# Patient Record
Sex: Female | Born: 1975 | Hispanic: Yes | Marital: Single | Smoking: Never smoker
Health system: Southern US, Community
[De-identification: ages and names within clinical notes are randomized; demographics above are authoritative.]

## PROBLEM LIST (undated history)

## (undated) DIAGNOSIS — E079 Disorder of thyroid, unspecified: Secondary | ICD-10-CM

---

## 2014-07-25 ENCOUNTER — Other Ambulatory Visit (HOSPITAL_COMMUNITY): Payer: Self-pay | Admitting: Family

## 2014-07-25 DIAGNOSIS — Z1231 Encounter for screening mammogram for malignant neoplasm of breast: Secondary | ICD-10-CM

## 2014-08-21 ENCOUNTER — Ambulatory Visit (HOSPITAL_COMMUNITY)
Admission: RE | Admit: 2014-08-21 | Discharge: 2014-08-21 | Disposition: A | Discharge status: Home / Self Care | Payer: Self-pay | Source: Ambulatory Visit | Attending: Family | Admitting: Family

## 2014-08-21 DIAGNOSIS — Z1231 Encounter for screening mammogram for malignant neoplasm of breast: Secondary | ICD-10-CM | POA: Insufficient documentation

## 2020-03-25 ENCOUNTER — Other Ambulatory Visit (HOSPITAL_COMMUNITY): Payer: Self-pay | Admitting: *Deleted

## 2020-03-25 DIAGNOSIS — Z1231 Encounter for screening mammogram for malignant neoplasm of breast: Secondary | ICD-10-CM

## 2020-04-07 ENCOUNTER — Ambulatory Visit (HOSPITAL_COMMUNITY): Payer: PRIVATE HEALTH INSURANCE

## 2020-04-10 ENCOUNTER — Ambulatory Visit (HOSPITAL_COMMUNITY)
Admission: RE | Admit: 2020-04-10 | Discharge: 2020-04-10 | Disposition: A | Discharge status: Home / Self Care | Payer: PRIVATE HEALTH INSURANCE | Source: Ambulatory Visit | Attending: *Deleted | Admitting: *Deleted

## 2020-04-10 ENCOUNTER — Other Ambulatory Visit: Payer: Self-pay

## 2020-04-10 DIAGNOSIS — Z1231 Encounter for screening mammogram for malignant neoplasm of breast: Secondary | ICD-10-CM | POA: Insufficient documentation

## 2021-08-14 ENCOUNTER — Encounter (HOSPITAL_COMMUNITY): Payer: Self-pay | Admitting: Emergency Medicine

## 2021-08-14 ENCOUNTER — Emergency Department (HOSPITAL_COMMUNITY)
Admission: EM | Admit: 2021-08-14 | Discharge: 2021-08-15 | Disposition: A | Discharge status: Home / Self Care | Payer: Self-pay | Attending: Emergency Medicine | Admitting: Emergency Medicine

## 2021-08-14 DIAGNOSIS — N3000 Acute cystitis without hematuria: Secondary | ICD-10-CM | POA: Insufficient documentation

## 2021-08-14 HISTORY — DX: Disorder of thyroid, unspecified: E07.9

## 2021-08-14 LAB — BASIC METABOLIC PANEL
Anion gap: 6 (ref 5–15)
BUN: 15 mg/dL (ref 6–20)
CO2: 26 mmol/L (ref 22–32)
Calcium: 8.6 mg/dL — ABNORMAL LOW (ref 8.9–10.3)
Chloride: 104 mmol/L (ref 98–111)
Creatinine, Ser: 0.74 mg/dL (ref 0.44–1.00)
GFR, Estimated: >60 mL/min (ref >60)
Glucose, Bld: 115 mg/dL — ABNORMAL HIGH (ref 70–99)
Potassium: 3.7 mmol/L (ref 3.5–5.1)
Sodium: 136 mmol/L (ref 135–145)

## 2021-08-14 LAB — CBC
HCT: 38.6 % (ref 36.0–46.0)
Hemoglobin: 12.8 g/dL (ref 12.0–15.0)
MCH: 27.9 pg (ref 26.0–34.0)
MCHC: 33.2 g/dL (ref 30.0–36.0)
MCV: 84.1 fL (ref 80.0–100.0)
Platelets: 249 10*3/uL (ref 150–400)
RBC: 4.59 MIL/uL (ref 3.87–5.11)
RDW: 12.3 % (ref 11.5–15.5)
WBC: 7.5 10*3/uL (ref 4.0–10.5)
nRBC: 0 % (ref 0.0–0.2)

## 2021-08-14 MED ORDER — ACETAMINOPHEN 325 MG PO TABS
650.0000 mg | ORAL_TABLET | Freq: Once | ORAL | Status: AC
  Administered 2021-08-14: 650 mg via ORAL
  Filled 2021-08-14: qty 2

## 2021-08-14 NOTE — ED Triage Notes (Addendum)
Pt c/o lower back pain, fever, chills, pain with urination, and headache for the past 4 days.

## 2021-08-15 LAB — URINALYSIS, ROUTINE W REFLEX MICROSCOPIC
Bilirubin Urine: NEGATIVE
Glucose, UA: NEGATIVE mg/dL
Ketones, ur: NEGATIVE mg/dL
Nitrite: NEGATIVE
Protein, ur: 30 mg/dL — AB
Specific Gravity, Urine: 1.017 (ref 1.005–1.030)
pH: 6 (ref 5.0–8.0)

## 2021-08-15 LAB — PREGNANCY, URINE: Preg Test, Ur: NEGATIVE

## 2021-08-15 MED ORDER — CEPHALEXIN 500 MG PO CAPS: 500.0000 mg | ORAL_CAPSULE | Freq: Two times a day (BID) | ORAL | 0 refills | Status: AC

## 2021-08-15 MED ORDER — LACTATED RINGERS IV BOLUS: 1000.0000 mL | Freq: Once | INTRAVENOUS | Status: DC

## 2021-08-15 MED ORDER — SODIUM CHLORIDE 0.9 % IV BOLUS
1000.0000 mL | Freq: Once | INTRAVENOUS | Status: AC
  Administered 2021-08-15: 1000 mL via INTRAVENOUS

## 2021-08-15 MED ORDER — SODIUM CHLORIDE 0.9 % IV SOLN
1.0000 g | Freq: Once | INTRAVENOUS | Status: AC
  Administered 2021-08-15: 1 g via INTRAVENOUS
  Filled 2021-08-15: qty 10

## 2021-08-15 NOTE — ED Provider Notes (Signed)
[facility] EMERGENCY DEPARTMENT  Provider Note  CSN: [id number] Arrival date & time: 08/14/21 2212  History Chief Complaint  Patient presents with   Multiple Complaints    Olivia Hicks is a 46 y.o. female here with daughter who helps translate, declined video interpreter. Patient has had 4 days of feeling poorly, general malaise, chills, fevers, back pain and dysuria. Some nausea but no vomiting. Poor appetite the last few days. No prior history of same.    Home Medications Prior to Admission medications   Medication Sig Start Date End Date Taking? Authorizing Provider  cephALEXin (KEFLEX) 500 MG capsule Take 1 capsule (500 mg total) by mouth 2 (two) times daily for 7 days. 08/15/21 08/22/21 Yes Pollyann Savoy, MD     Allergies    Patient has no known allergies.   Review of Systems   Review of Systems Please see HPI for pertinent positives and negatives  Physical Exam BP (!) 91/57   Pulse 81   Temp 98.4 F (36.9 C) (Oral)   Resp 17   Ht 5' (1.524 m)   Wt 81.6 kg   SpO2 95%   BMI 35.15 kg/m   Physical Exam Vitals and nursing note reviewed.  Constitutional:      Appearance: Normal appearance.  HENT:     Head: Normocephalic and atraumatic.     Nose: Nose normal.     Mouth/Throat:     Mouth: Mucous membranes are dry.  Eyes:     Extraocular Movements: Extraocular movements intact.     Conjunctiva/sclera: Conjunctivae normal.  Cardiovascular:     Rate and Rhythm: Normal rate.  Pulmonary:     Effort: Pulmonary effort is normal.     Breath sounds: Normal breath sounds.  Abdominal:     General: Abdomen is flat.     Palpations: Abdomen is soft.     Tenderness: There is abdominal tenderness (suprapubic, mild). There is no guarding.  Musculoskeletal:        General: No swelling. Normal range of motion.     Cervical back: Neck supple.  Skin:    General: Skin is warm and dry.  Neurological:     General: No focal deficit present.     Mental Status: She is  alert.  Psychiatric:        Mood and Affect: Mood normal.    ED Results / Procedures / Treatments   EKG None  Procedures Procedures  Medications Ordered in the ED Medications  acetaminophen (TYLENOL) tablet 650 mg (650 mg Oral Given 08/14/21 2355)  cefTRIAXone (ROCEPHIN) 1 g in sodium chloride 0.9 % 100 mL IVPB (0 g Intravenous Stopped 08/15/21 0137)  sodium chloride 0.9 % bolus 1,000 mL (1,000 mLs Intravenous New Bag/Given 08/15/21 0058)    Initial Impression and Plan  Patient here with symptoms concerning for UTI/pyelonephritis. Noted to be febrile on arrival, but otherwise normal vitals. CBC is normal, BMP unremarkable. UA consistent with UTI. Will give rocephin and IVF and reassess.   ED Course   Clinical Course as of 08/15/21 0231  Sat Aug 15, 2021  0227 Patient resting comfortably. Temp is improved. BP borderline low while asleep but no other signs of sepsis. Will d/c with Rx for Keflex, encouraged to stay hydrated. PCP follow up or RTED if not improving.  [CS]    Clinical Course User Index [CS] Pollyann Savoy, MD     MDM Rules/Calculators/A&P Medical Decision Making Given presenting complaint, I considered that admission might be necessary.  After review of results from ED lab and/or imaging studies, admission to the hospital is not indicated at this time.    Problems Addressed: Acute cystitis without hematuria: acute illness or injury  Amount and/or Complexity of Data Reviewed Labs: ordered. Decision-making details documented in ED Course.  Risk OTC drugs. Prescription drug management. Decision regarding hospitalization.    Final Clinical Impression(s) / ED Diagnoses Final diagnoses:  Acute cystitis without hematuria    Rx / DC Orders ED Discharge Orders          Ordered    cephALEXin (KEFLEX) 500 MG capsule  2 times daily        08/15/21 0231             Pollyann Savoy, MD 08/15/21 [id number]

## 2021-12-19 IMAGING — MG MM DIGITAL SCREENING BILAT W/ TOMO AND CAD
8 series · 8 of 24 positions shown · non-contrast
Comparison: Previous exam(s).

CLINICAL DATA: Screening.

EXAM:
DIGITAL SCREENING BILATERAL MAMMOGRAM WITH TOMO AND CAD

[L MLO synth-2D]
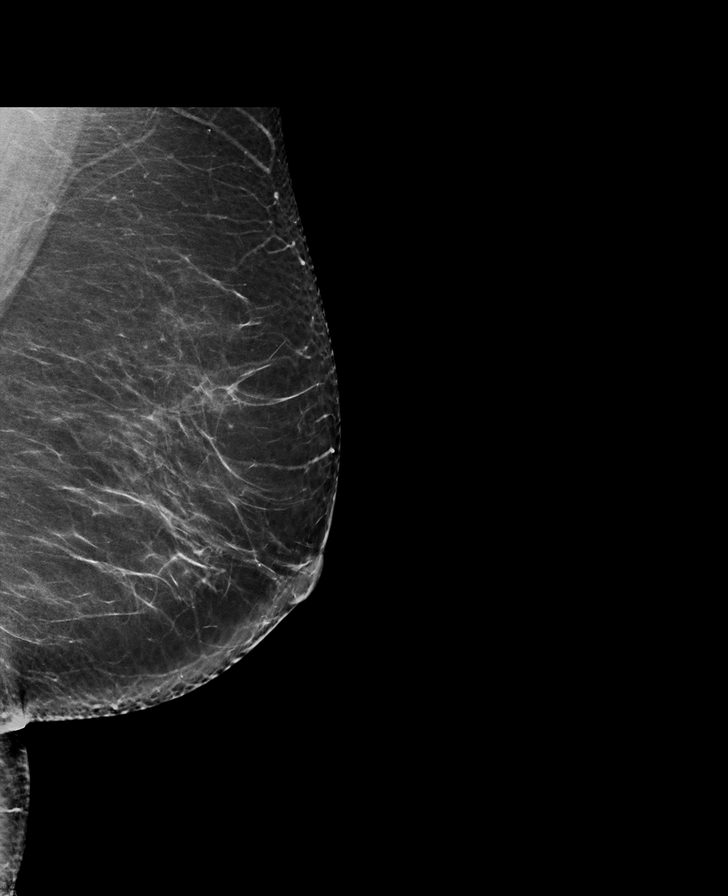

[L CC synth-2D]
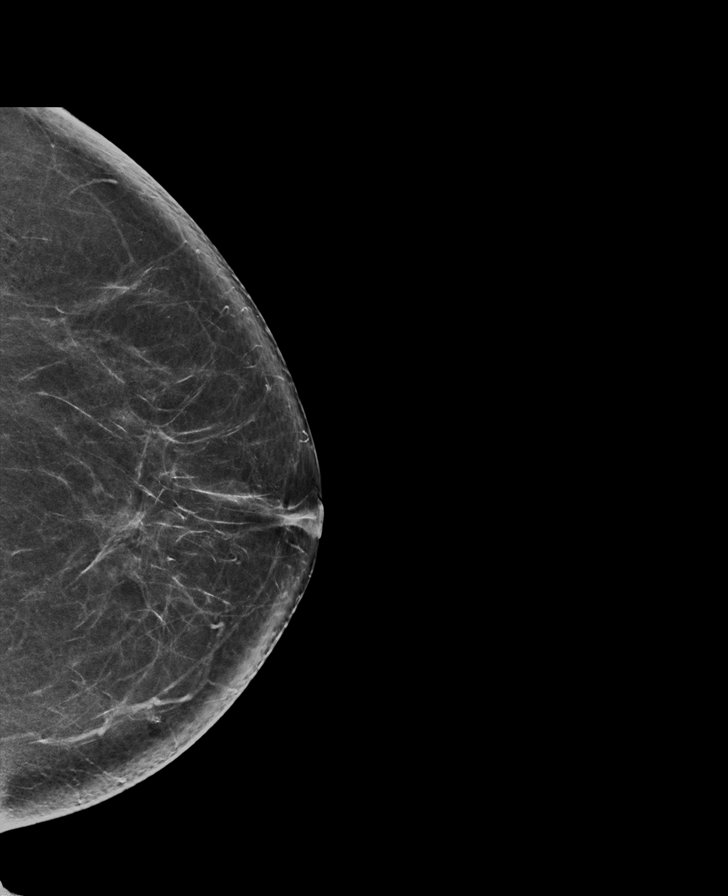

[R CC synth-2D]
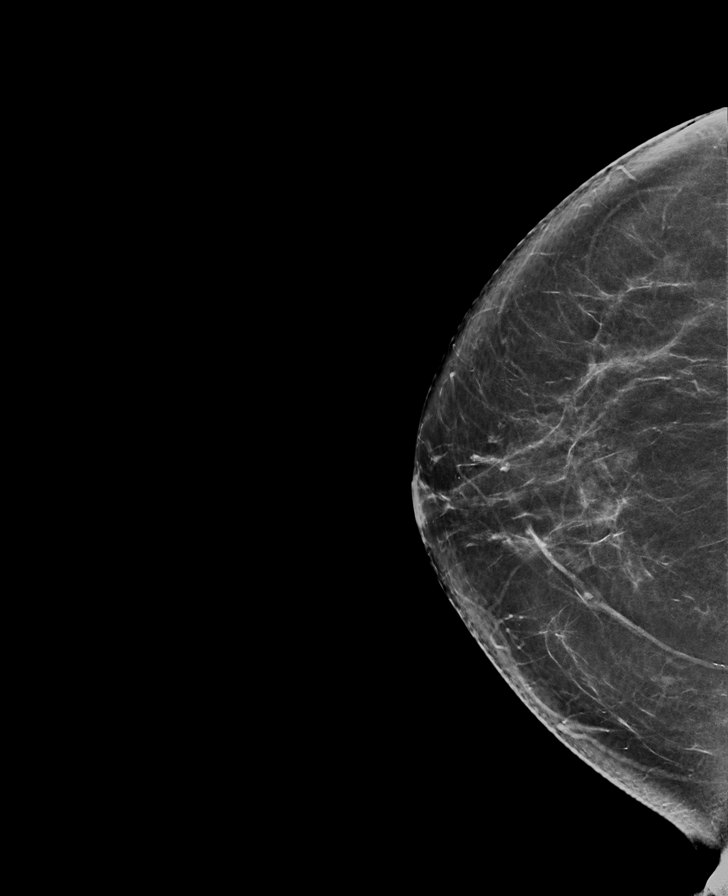

[R MLO synth-2D]
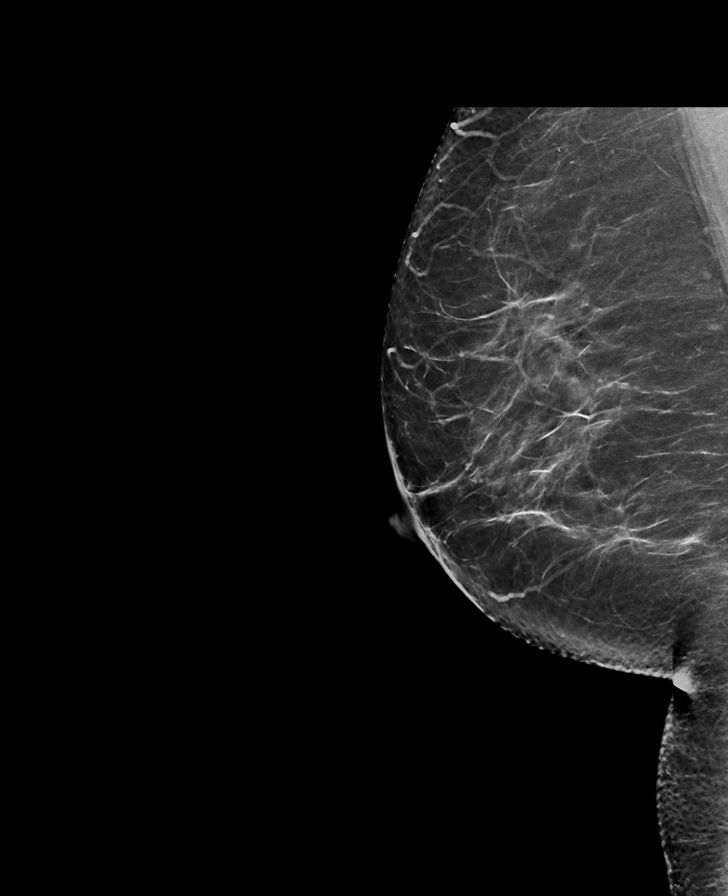

[R MLO tomo · tomo slice 39/78.0]
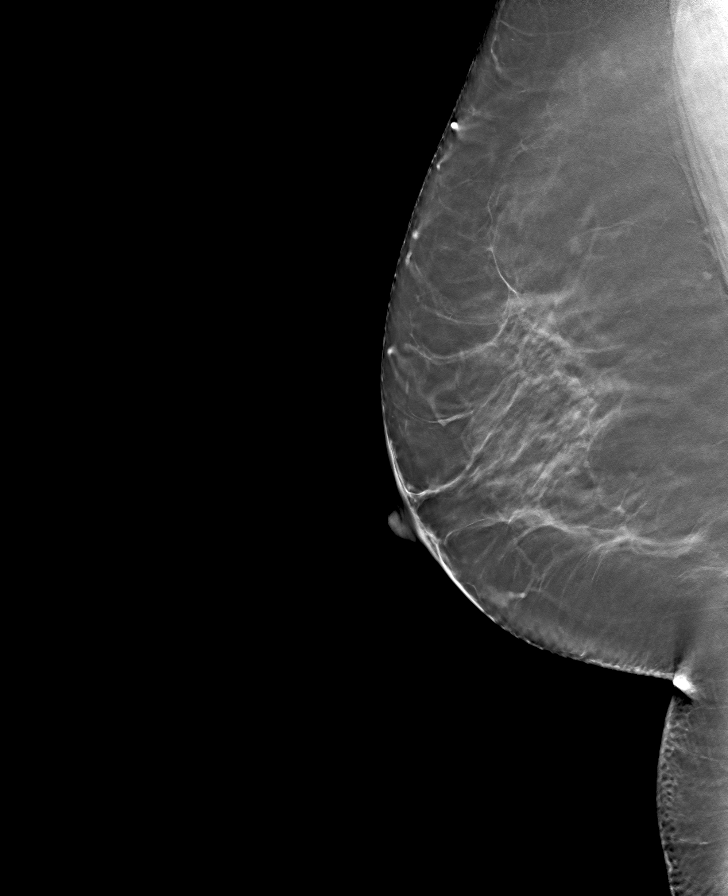

[L CC tomo · tomo slice 40/79.0]
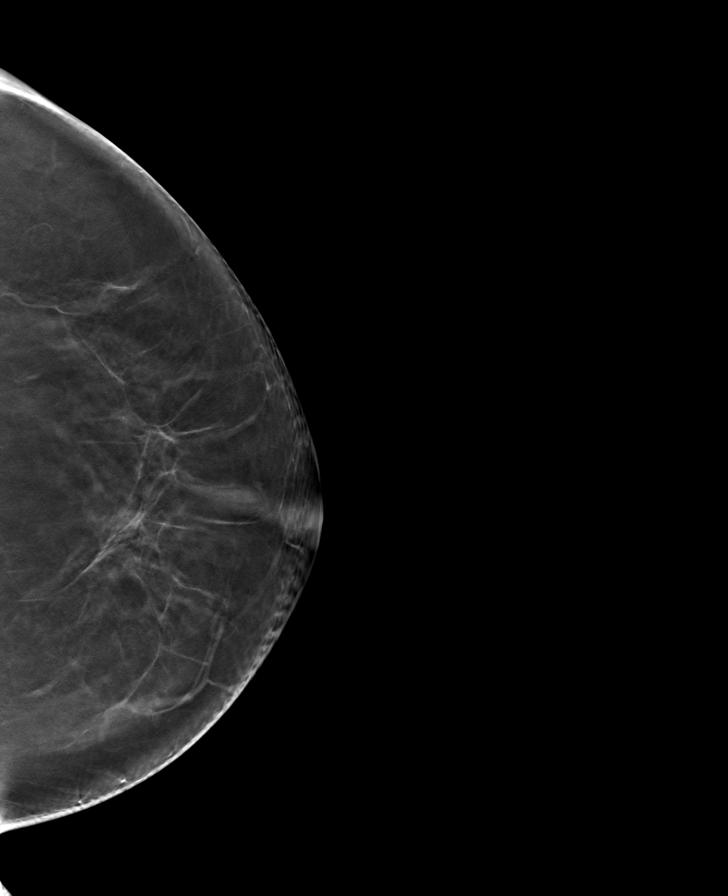

[R CC tomo · tomo slice 39/77.0]
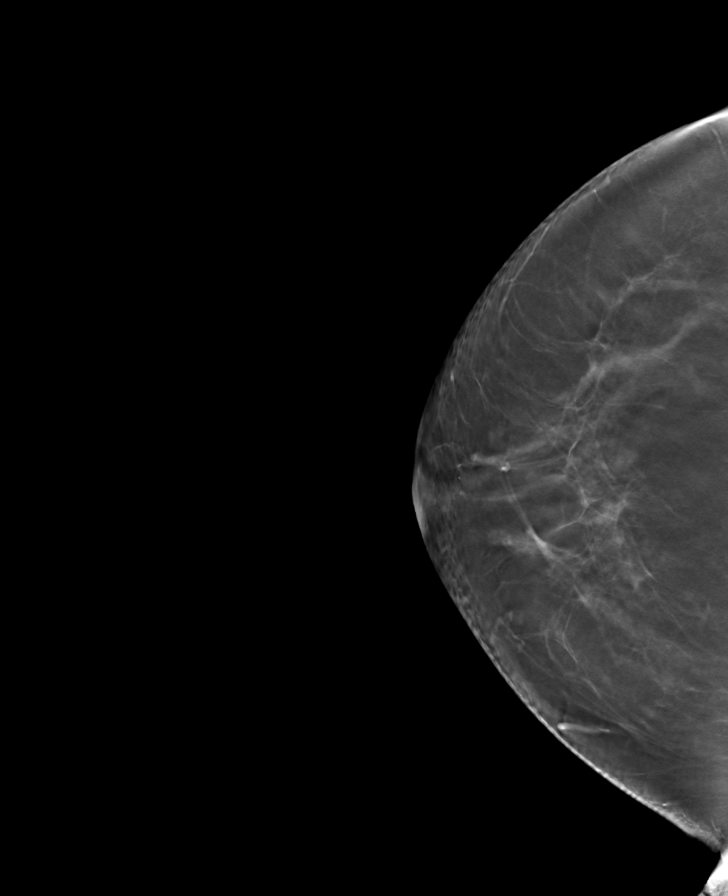

[L MLO tomo · tomo slice 42/83.0]
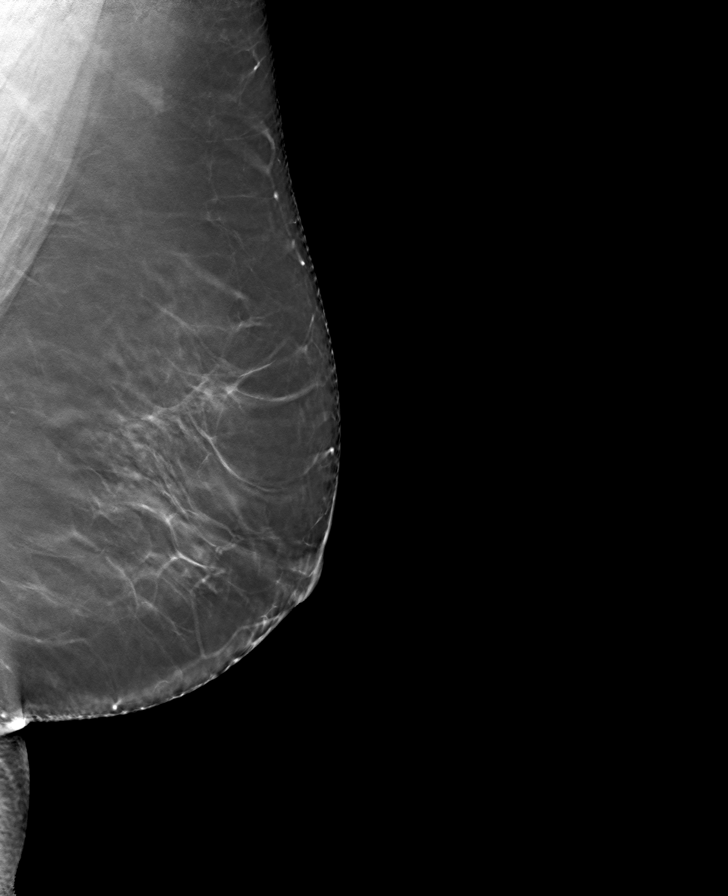

[8 of 24 positions shown; findings below may reference images not displayed]

ACR Breast Density Category b: There are scattered areas of
fibroglandular density.
FINDINGS: There are no findings suspicious for malignancy. The images were
evaluated with computer-aided detection.
IMPRESSION: No mammographic evidence of malignancy. A result letter of this
screening mammogram will be mailed directly to the patient.

RECOMMENDATION:
Screening mammogram in one year. (Code:ZP-7-VX7)

BI-RADS CATEGORY  1: Negative.

## 2022-07-22 ENCOUNTER — Other Ambulatory Visit: Payer: Self-pay | Admitting: *Deleted

## 2022-07-22 DIAGNOSIS — Z1231 Encounter for screening mammogram for malignant neoplasm of breast: Secondary | ICD-10-CM

## 2022-08-11 ENCOUNTER — Ambulatory Visit (HOSPITAL_COMMUNITY)
Admission: RE | Admit: 2022-08-11 | Discharge: 2022-08-11 | Disposition: A | Discharge status: Home / Self Care | Payer: Self-pay | Source: Ambulatory Visit | Attending: *Deleted | Admitting: *Deleted

## 2022-08-11 DIAGNOSIS — Z1231 Encounter for screening mammogram for malignant neoplasm of breast: Secondary | ICD-10-CM | POA: Insufficient documentation

## 2022-12-06 ENCOUNTER — Ambulatory Visit (HOSPITAL_COMMUNITY)
Admission: RE | Admit: 2022-12-06 | Discharge: 2022-12-06 | Disposition: A | Discharge status: Home / Self Care | Payer: Self-pay | Source: Ambulatory Visit | Attending: Physician Assistant | Admitting: Physician Assistant

## 2022-12-06 ENCOUNTER — Other Ambulatory Visit (HOSPITAL_COMMUNITY): Payer: Self-pay | Admitting: Physician Assistant

## 2022-12-06 DIAGNOSIS — R52 Pain, unspecified: Secondary | ICD-10-CM | POA: Insufficient documentation

## 2023-05-24 ENCOUNTER — Telehealth: Payer: Self-pay

## 2023-05-24 NOTE — Telephone Encounter (Signed)
 Attempted wellness/follow up call with interpreter services. NO answer, left message.  [facility] is primary care and was last seen on 12/04/22 Next appointment is 06/06/23   Francee Nodal RN [facility]

## 2023-09-29 ENCOUNTER — Other Ambulatory Visit: Payer: Self-pay | Admitting: *Deleted

## 2023-09-29 DIAGNOSIS — Z1231 Encounter for screening mammogram for malignant neoplasm of breast: Secondary | ICD-10-CM

## 2023-10-11 ENCOUNTER — Encounter (INDEPENDENT_AMBULATORY_CARE_PROVIDER_SITE_OTHER): Payer: Self-pay | Admitting: *Deleted

## 2023-10-19 ENCOUNTER — Encounter (HOSPITAL_COMMUNITY): Payer: Self-pay

## 2023-10-19 ENCOUNTER — Ambulatory Visit (HOSPITAL_COMMUNITY)
Admission: RE | Admit: 2023-10-19 | Discharge: 2023-10-19 | Disposition: A | Discharge status: Home / Self Care | Payer: Self-pay | Source: Ambulatory Visit | Attending: *Deleted | Admitting: *Deleted

## 2023-10-19 DIAGNOSIS — Z1231 Encounter for screening mammogram for malignant neoplasm of breast: Secondary | ICD-10-CM | POA: Insufficient documentation

## 2024-01-18 ENCOUNTER — Ambulatory Visit: Payer: Self-pay
